# Patient Record
Sex: Male | Born: 1935 | Race: White | Hispanic: No | Marital: Married
Health system: Southern US, Community
[De-identification: ages and names within clinical notes are randomized; demographics above are authoritative.]

---

## 2006-08-30 ENCOUNTER — Ambulatory Visit: Payer: Self-pay | Admitting: Internal Medicine

## 2006-09-13 ENCOUNTER — Ambulatory Visit: Payer: Self-pay | Admitting: Unknown Physician Specialty

## 2010-08-09 ENCOUNTER — Ambulatory Visit: Payer: Self-pay | Admitting: Ophthalmology

## 2010-08-22 ENCOUNTER — Ambulatory Visit: Payer: Self-pay | Admitting: Ophthalmology

## 2010-10-12 ENCOUNTER — Ambulatory Visit: Payer: Self-pay | Admitting: Ophthalmology

## 2012-02-01 ENCOUNTER — Ambulatory Visit: Payer: Self-pay | Admitting: Ophthalmology

## 2012-02-01 LAB — POTASSIUM: Potassium: 3.6 mmol/L (ref 3.5–5.1)

## 2012-02-12 ENCOUNTER — Ambulatory Visit: Payer: Self-pay | Admitting: Ophthalmology

## 2014-12-27 NOTE — Op Note (Signed)
PATIENT NAME:  Aaron [name], Aaron [name] MR#:  [id number] DATE OF BIRTH:  Oct 08, 1926  DATE OF PROCEDURE:  02/12/2012  PREOPERATIVE DIAGNOSIS:  Cataract, left eye.    POSTOPERATIVE DIAGNOSIS:  Cataract, left eye.  PROCEDURE PERFORMED:  Extracapsular cataract extraction using phacoemulsification with placement of an Alcon [id number], 23.0-diopter posterior chamber lens, serial #[id number].  SURGEON:  Maylon PeppersSteven A. Atiya Yera, MD  ASSISTANT:  None.  ANESTHESIA:  4% lidocaine and 0.75% Marcaine in a 50/50 mixture with 10 units/mL of Hylenex added given as a peribulbar.   ANESTHESIOLOGIST:  Dr. Dimple Caseyice.   COMPLICATIONS:  None.  ESTIMATED BLOOD LOSS:  Less than 1 ml.  DESCRIPTION OF PROCEDURE:  The patient was brought to the operating room and given a peribulbar block.  The patient was then prepped and draped in the usual fashion.  The vertical rectus muscles were imbricated using 5-0 silk sutures.  These sutures were then clamped to the sterile drapes as bridle sutures.  A limbal peritomy was performed extending two clock hours and hemostasis was obtained with cautery.  A partial thickness scleral groove was made at the surgical limbus and dissected anteriorly in a lamellar dissection using an Alcon crescent knife.  The anterior chamber was entered supero-temporally with a Superblade and through the lamellar dissection with a 2.6 mm keratome.  DisCoVisc was used to replace the aqueous and a continuous tear capsulorrhexis was carried out.  Hydrodissection and hydrodelineation were carried out with balanced salt and a 27 gauge canula.  The nucleus was rotated to confirm the effectiveness of the hydrodissection.  Phacoemulsification was carried out using a divide-and-conquer technique.  Total ultrasound time was two minutes and thirty-eight seconds with an average power of 23.4 percent.  Irrigation/aspiration was used to remove the residual cortex.  DisCoVisc was used to inflate the capsule and the internal incision  was enlarged to 3 mm with the crescent knife.  The intraocular lens was folded and inserted into the capsular bag using the AcrySert delivery system.  Irrigation/aspiration was used to remove the residual DisCoVisc.  Miostat was injected into the anterior chamber through the paracentesis track to inflate the anterior chamber and induce miosis.  The wound was checked for leaks and none were found. The conjunctiva was closed with cautery and the bridle sutures were removed.  Two drops of 0.3% Vigamox were placed on the eye.   An eye shield was placed on the eye.  The patient was discharged to the recovery room in good condition.   ____________________________ Maylon PeppersSteven A. Haaris Metallo, MD sad:ap D: 02/12/2012 13:22:17 ET T: 02/12/2012 14:48:42 ET JOB#: [id number]  cc: Viviann SpareSteven A. Haiden Rawlinson, MD, <Dictator> Erline LevineSTEVEN A Merikay Lesniewski MD ELECTRONICALLY SIGNED 02/19/2012 13:01

## 2016-06-12 ENCOUNTER — Other Ambulatory Visit: Payer: Self-pay | Admitting: Internal Medicine

## 2016-06-12 DIAGNOSIS — R634 Abnormal weight loss: Secondary | ICD-10-CM

## 2016-06-12 DIAGNOSIS — R748 Abnormal levels of other serum enzymes: Secondary | ICD-10-CM

## 2016-06-16 ENCOUNTER — Ambulatory Visit
Admission: RE | Admit: 2016-06-16 | Discharge: 2016-06-16 | Disposition: A | Discharge status: Home / Self Care | Payer: Medicare Other | Source: Ambulatory Visit | Attending: Internal Medicine | Admitting: Internal Medicine

## 2016-06-16 DIAGNOSIS — R634 Abnormal weight loss: Secondary | ICD-10-CM | POA: Diagnosis present

## 2016-06-16 DIAGNOSIS — I700 Atherosclerosis of aorta: Secondary | ICD-10-CM | POA: Diagnosis not present

## 2016-06-16 DIAGNOSIS — N200 Calculus of kidney: Secondary | ICD-10-CM | POA: Insufficient documentation

## 2016-06-16 DIAGNOSIS — N400 Enlarged prostate without lower urinary tract symptoms: Secondary | ICD-10-CM | POA: Insufficient documentation

## 2016-06-16 DIAGNOSIS — R748 Abnormal levels of other serum enzymes: Secondary | ICD-10-CM | POA: Diagnosis present

## 2016-06-16 MED ORDER — IOPAMIDOL (ISOVUE-300) INJECTION 61%
100.0000 mL | Freq: Once | INTRAVENOUS | Status: AC | PRN
  Administered 2016-06-16: 100 mL via INTRAVENOUS

## 2018-01-11 IMAGING — CT CT ABD-PELV W/ CM
2 of 5 series · 14 of 46 positions shown, 16 images · IV contrast (APPLIED)
Comparison: None.

CLINICAL DATA: One month history of weight loss with abnormal liver
enzymes.

EXAM:
CT ABDOMEN AND PELVIS WITH CONTRAST
TECHNIQUE: Multidetector CT imaging of the abdomen and pelvis was performed
using the standard protocol following bolus administration of
intravenous contrast.
CONTRAST:  100mL C98C1D-RAA IOPAMIDOL (C98C1D-RAA) INJECTION 61%

[Series 2: axial st · axial · 0.71mm/px · z∈[-488,-104]mm · 11 of 89 slices shown, 13 images]
[im 6/89  soft-tissue]
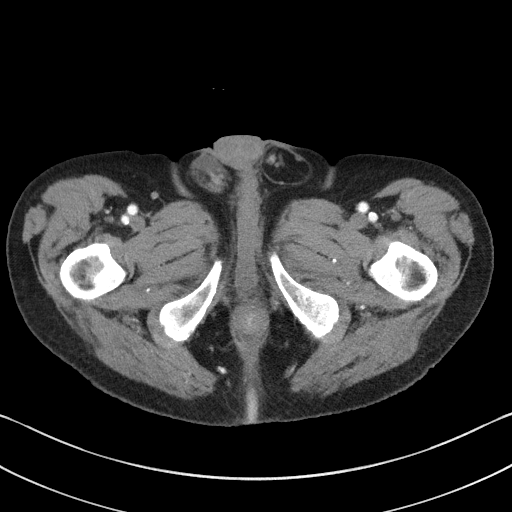
[im 6/89  bone]
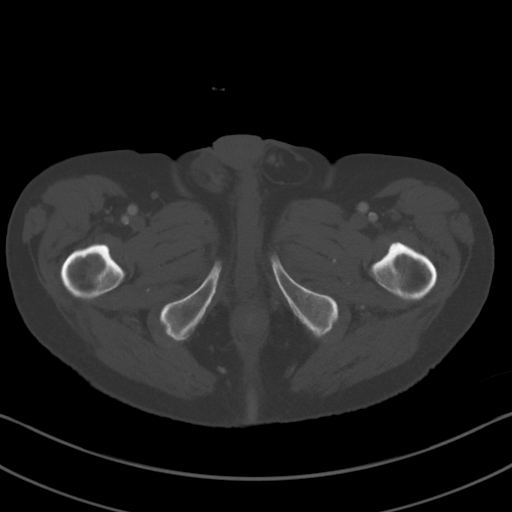
[im 17/89  soft-tissue]
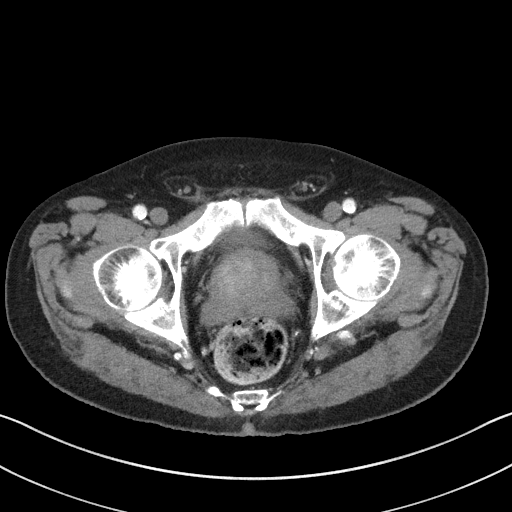
[im 23/89  soft-tissue]
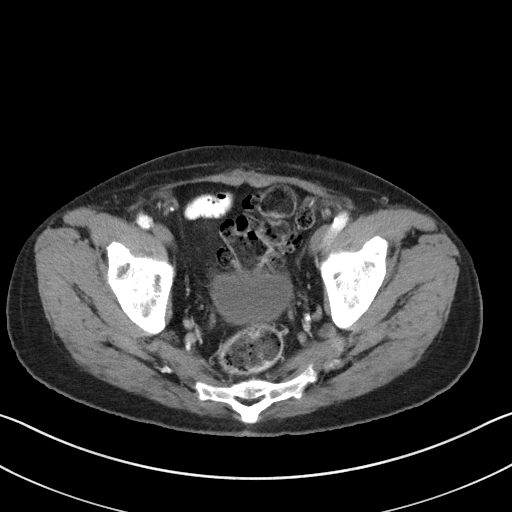
[im 28/89  soft-tissue]
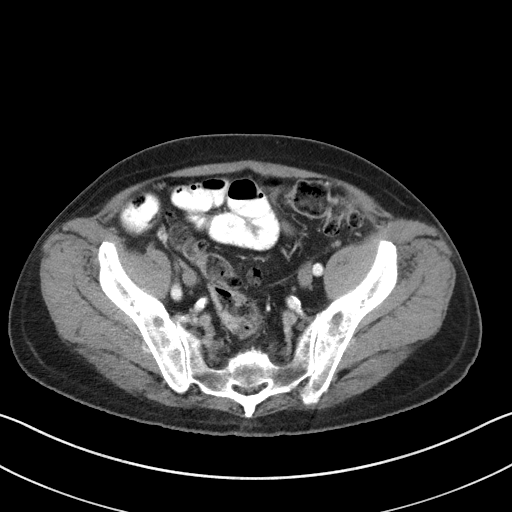
[im 39/89  soft-tissue]
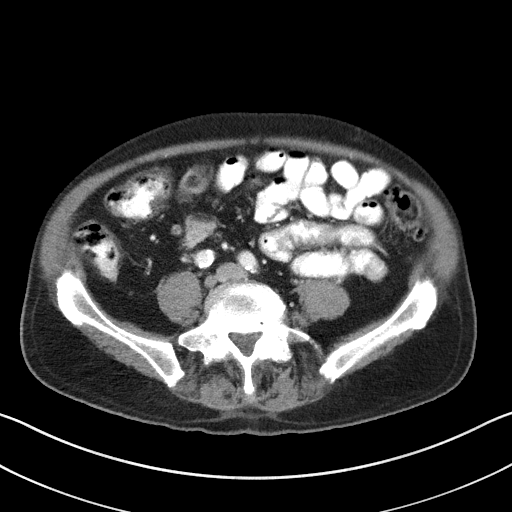
[im 45/89  soft-tissue]
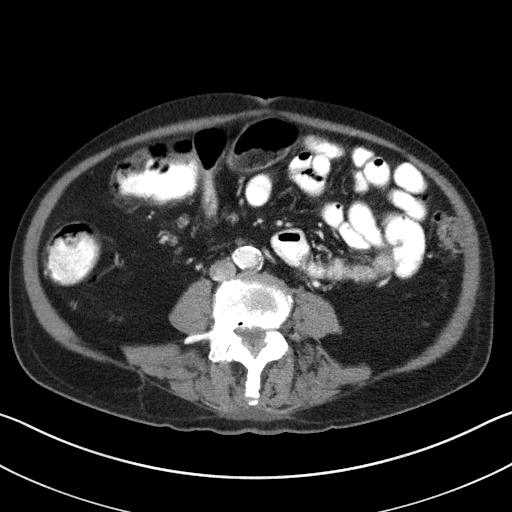
[im 50/89  soft-tissue]
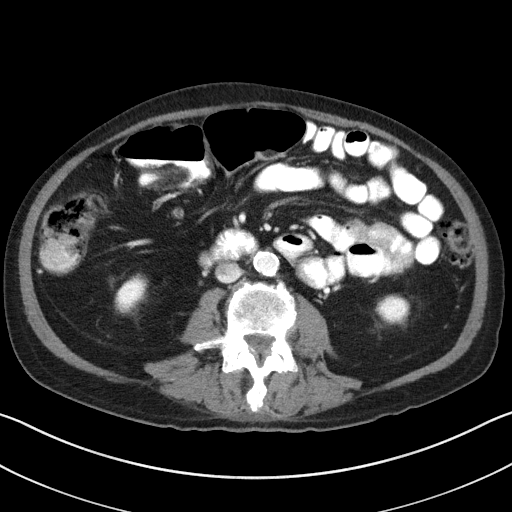
[im 61/89  soft-tissue]
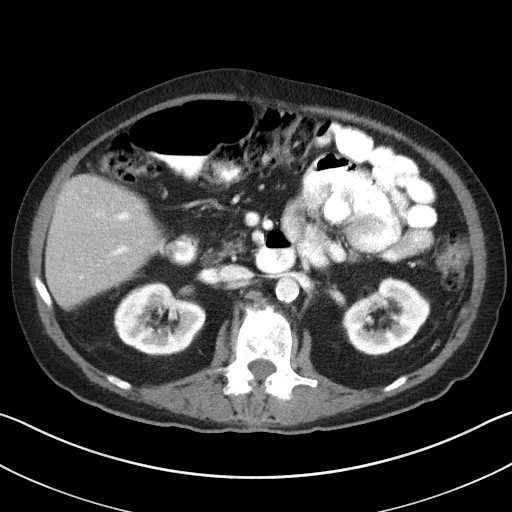
[im 67/89  soft-tissue]
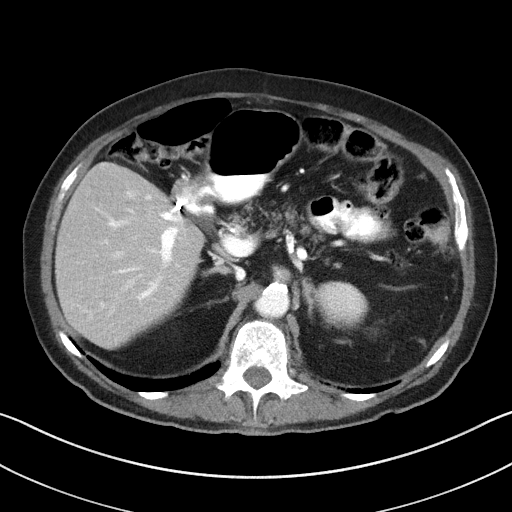
[im 67/89  bone]
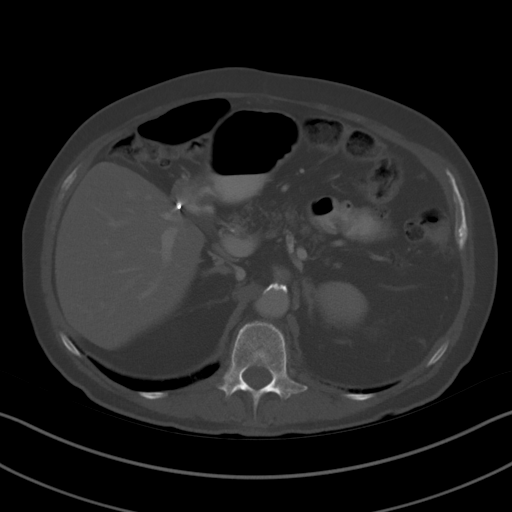
[im 72/89  soft-tissue]
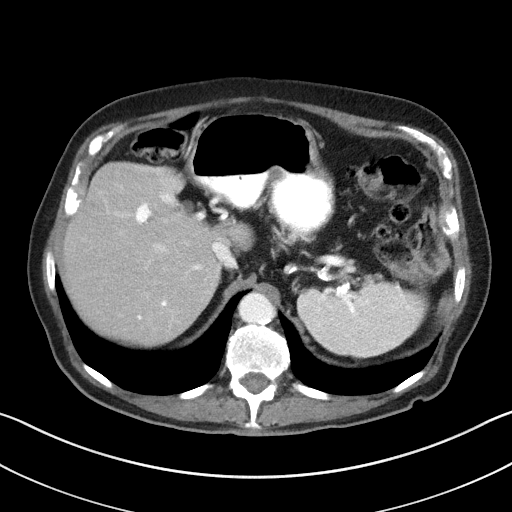
[im 83/89  soft-tissue]
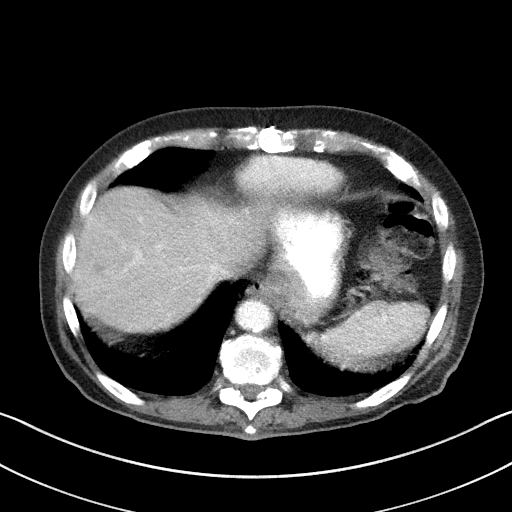

[Series 5: coronal st · coronal · 0.64mm/px · 3 of 82 slices shown]
[im 28/82  soft-tissue]
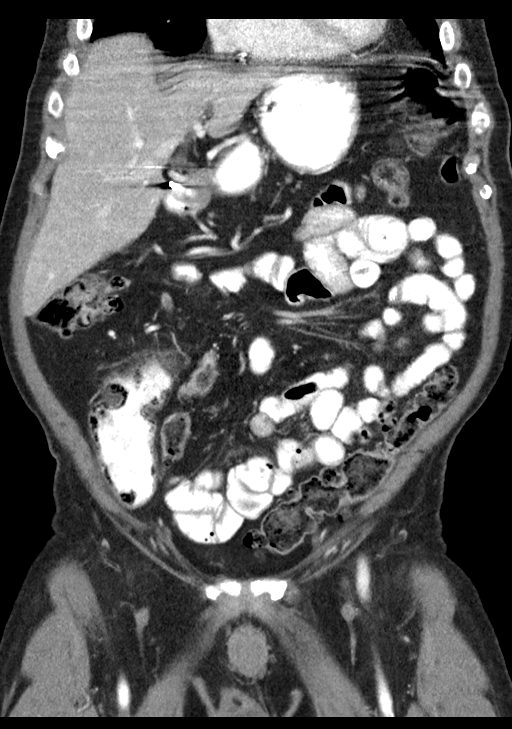
[im 37/82  soft-tissue]
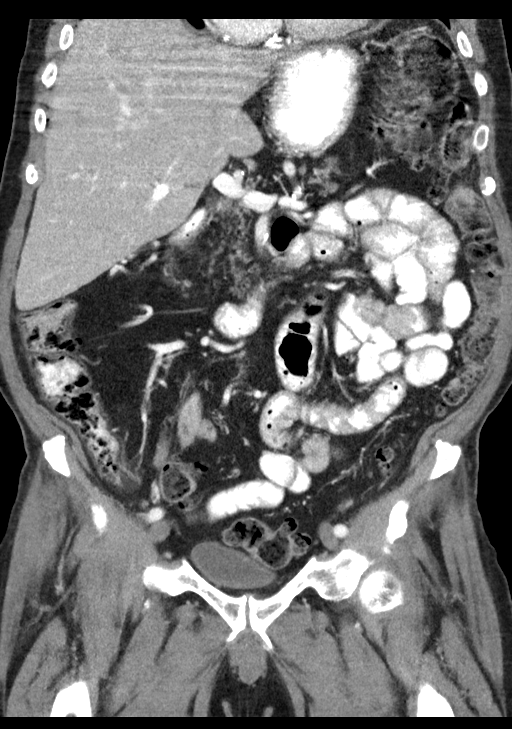
[im 46/82  soft-tissue]
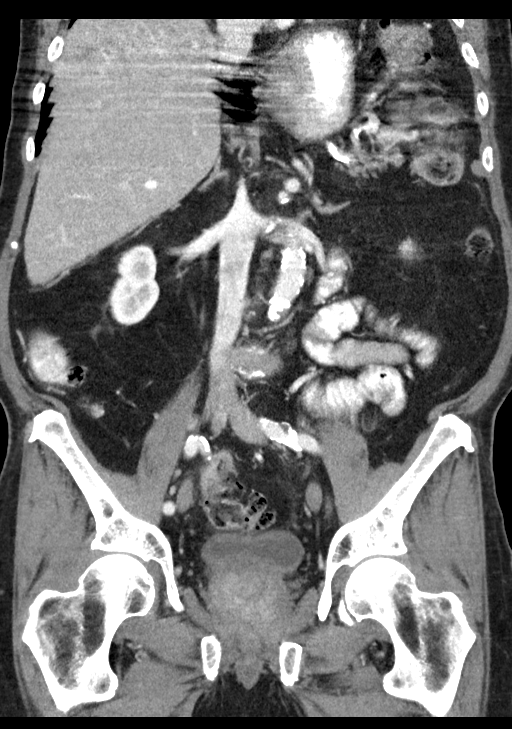

[14 of 46 positions shown; findings below may reference images not displayed]

FINDINGS: Note that there is a degree of motion artifact, particularly
affecting portions of the upper abdomen.

Lower chest: There is mild bibasilar lung atelectasis. No lung base
edema or consolidation. There is extensive native coronary artery
calcification. Patient is status post coronary artery bypass
grafting.

Hepatobiliary: Liver is approximately 22 cm in length. There is a 5
mm probable cyst near the dome of the liver on the right anteriorly.
No other focal liver lesions are evident. Gallbladder is absent.
There is no appreciable biliary duct dilatation.

Pancreas: Pancreas is partially fatty replaced. No pancreatic mass
or inflammatory focus.

Spleen: No splenic lesions are evident.

Adrenals/Urinary Tract: Adrenals appear normal bilaterally. There is
a cyst in the anterior mid left kidney measuring 9 x 6 mm. No
hydronephrosis is evident on either side. There are two, 2 mm
calculi in the periphery of the mid right kidney, nonobstructing. No
other renal calculi are identified. There is no ureteral calculus on
either side. Urinary bladder is midline with wall thickness within
normal limits.

Stomach/Bowel: Rectum is mildly distended with stool. The rectal
wall does not appear thickened, and there is no perirectal soft
tissue stranding. There are scattered sigmoid diverticula without
diverticulitis. Scattered colonic diverticula are noted elsewhere
without diverticulitis. There is no appreciable bowel wall or
mesenteric thickening. There is no evident bowel obstruction. No
free air or portal venous air. Note that there is mild dilatation of
the ascending colon with a maximum transverse diameter of 9.3 cm.
There is no other evident bowel dilatation on this study. No bowel
pneumatosis.

Vascular/Lymphatic: There is extensive atherosclerotic calcification
throughout the aorta and common iliac arteries. There is also
calcification in each common femoral and hypogastric artery regions.
There is no abdominal aortic aneurysm. There is moderate
calcification in the proximal mesenteric vessels throughout the
abdomen. There is no adenopathy in the abdomen or pelvis.

Reproductive: The prostate is diffusely enlarged and impresses on
the inferior urinary bladder. Seminal vesicles are also prominent.
There are a few tiny prostatic calculi. No pelvic mass apart from
prostatic enlargement. No free pelvic fluid evident.

Other: Appendix appears unremarkable. There is no ascites or abscess
in the abdomen or pelvis. There is fat in each inguinal ring.

Musculoskeletal: There is degenerative change in the lower thoracic
and lumbar spine regions. No blastic or lytic bone lesions are
demonstrable. There is no intramuscular or abdominal wall lesion.
IMPRESSION: There is dilatation of the ascending colon consistent with probable
early colonic ileus. No bowel obstruction. No bowel wall or
mesenteric thickening. No pneumatosis.

Rectum mildly distended with stool without surrounding mesenteric or
perinephric stranding/ thickening.

No abscess.  Appendix appears normal.

Enlarged prostate impressing on the inferior urinary bladder.
Seminal vesicles also prominent. Advise correlation with PSA.

Small nonobstructing calculi mid right kidney. No hydronephrosis or
ureteral calculi.

Extensive aortoiliac atherosclerosis.  No aneurysm.

Prominent liver without focal lesion beyond a sub cm probable small
cyst. Gallbladder absent.

## 2019-02-10 ENCOUNTER — Telehealth: Payer: Self-pay | Admitting: Primary Care

## 2019-02-10 NOTE — Telephone Encounter (Signed)
Spoke with patient's daughter Barnett Hatter and scheduled a Lewis for 02/13/19 @ 3:30 PM.

## 2019-02-13 ENCOUNTER — Other Ambulatory Visit: Payer: Self-pay

## 2019-02-13 ENCOUNTER — Other Ambulatory Visit: Payer: Medicare Other | Admitting: Primary Care

## 2019-02-13 DIAGNOSIS — Z515 Encounter for palliative care: Secondary | ICD-10-CM

## 2019-02-13 NOTE — Progress Notes (Signed)
[facility] Pall[facility] Consult Note Telephone: [phone]  Fax: [phone]  TELEHEALTH VISIT STATEMENT Due to the COVID-19 crisis, this visit was done via telemedicine from my office. It was initiated and consented to by this patient and/or family.  PATIENT NAME: Aaron [name] DOB: Aug 28, 1927 MRN: [id number]  PRIMARY CARE PROVIDER:   Rusty Aus, [place]  REFERRING PROVIDER:  Rusty Aus, MD [place],  [place] [address] [phone]  RESPONSIBLE PARTY:   Extended Emergency Contact Information Primary Emergency Contact: Pavlock,Lettie A Address: [address] [place], [place] [address] [place] Phone: [phone] Mobile Phone: [phone] Relation: Spouse  Palliative Care was asked to follow patient by consultation request of Rusty Aus, MD. This is the initial visit. Family would like assessment for hospice appropriateness. Present were by telemedicine, patient, son in law and daughter Aaron [name].  ASSESSMENT AND RECOMMENDATIONS:   1. Goals of Care: Maximize quality of life and symptom management.  2. Symptom Management:   Pain: Recommend Acetaminophen arthritis 650 mg q 8 hrs. Probably due to immobility, neurological insult. Patient restless and groaning, it is the convention to assume pain first and treat. If this does not help we will look at other factors.   Sleep disturbance/Sundowning/night awakening: Recommend melatonisn 3 mg at hs, resume zoloft, family stopped this medication.  Sleeping more in the day, and in his recliner. Family has been giving Tyenol PM, which has diphenhydramine, at hs. Education provided this can give a paradoxical effect in the elderly, recommend resuming melatonin. Also family stopped zoloft, I recommend restarting for sleep and wellbeing. Encouraged to keep him stimulated during the day and not allow day  time sleeping for hours.  Family also wishes to obtain a hospital bed for improving sleeping and comfort. I refer this to PCP to send referral to DME company. There was some discrepancy as to if patient has an open wound or not. Family said he did and home health nurse said it was stage 1. If patient has skin breakdown he would benefit from a skin protecting mattress surface.   Nutrition/Aspiration risk: Recommend ensure 2-3 x/day, recommend resolving constipation, see below. ST currently for swallowing assessment. Right sided deficits weakness, speech deficits e.g. word finding, mild expressive aphasia. Seems to have no receptive aphagia. Poor eating per family but is drinking fluids, taking thin liquids without problem. Family also endorses early satiety. Taking ensure daily.   Constipation:  I asked them to begin an OTC protocol of Miralax 17 gm daily in 8 oz fluid, senna tab bid, obtain dietary fiber e.g. gummy fiber soft chews available in pharmacies. Also to obtain milk of magnesia and give several doses to start evacuation. Please also order lactulose 30 ml daily and prn for ongoing constipation of no bm x 3 days.   Daughter reports bms x 4 occasions since patient came home from nursing home, 21 days ago. She reports no laxatives or softeners are currently in patient med protocol. Education provided that bowel function needs to be maintained to optimize intake, comfort, cognition and ability to rest.. I asked them to provide a witness to a satisfactory evacuation, and to begin both MOM and standing protocol now. I asked home health LPN to assess on Monday and I will meet with them again on Thursday. Bowel goal should be 4 soft formed stools/ week as minimum.  3. Family Supports:  Recommend CNA from home health. We will discuss other care giving options in future visits. Patient is now home x 3 weeks from SNF s/p CVA in late April 2020. [facility] has PT 2x/ week, ST also seeing patient  and SN. Not currently receiving  home health aides or OT or SW.  I requested CNA of LPN, and would ask PCP to send order to [facility] for CNA.  Pt is able to brush teeth some, feed himself with finger food, not as well with utensils.   4. Cognitive / Functional decline:  Patient had dementia at baseline, FAST Score 6 b-c. Currently rehabbing from CVA in April.    5. Advanced Care Directive: Has a living will, [facility] as wife and daughter, only child.  Does not want resuscitation or living on machines per daughter. I explained MOST form to daughter and asked that we fill it out next visit and I will upload to the [facility] data base.   6. Follow up Palliative Care Visit: Palliative care will continue to follow for goals of care clarification and symptom management. Return 1 weeks or prn. We will work on bowel program and uploading MOST form, discuss appropriate time to transition to hospice.  I spent 75 minutes providing this consultation,  from 1530 to 1645. More than 50% of the time in this consultation was spent coordinating communication.   HISTORY OF PRESENT ILLNESS:  Aaron [name] is a 85 y.o. year old male with multiple medical problems including ischemic CVA, HTN, HLD, CAD, dementia mixed, vascular and AD. Palliative Care was asked to help address goals of care.   CODE STATUS:  DNR  PPS: 40% HOSPICE ELIGIBILITY/DIAGNOSIS: TBD. Probably function is too high at this assessment for hospice admission, but I will continue to assess for appropriate referral. A hospice diagnosis is not clear at this writing. Continue to assess.  PAST MEDICAL HISTORY: No past medical history on file.  SOCIAL HX:  Social History   Tobacco Use  . Smoking status: Not on file  Substance Use Topics  . Alcohol use: Not on file    ALLERGIES: Not on File   PERTINENT MEDICATIONS:  No outpatient encounter medications on file as of 02/13/2019.   No facility-administered encounter medications on file as  of 02/13/2019.     PHYSICAL EXAM/ROS:   Current and past weights: unavailable General: NAD, frail appearing,  Cardiovascular: no chest pain reported, no edema,  Pulmonary: no cough, no increased SOB Abdomen: appetite fair to poor, early satiety, endorses constipation, but family reports has had a bm 4 x/ 21 days. MOM for 2-3 doses to get bowels to move. Other bowel preparations recommended. GU: denies dysuria, incontinent of urine at times MSK:  ambulates with a walker , stand by assist Skin: no rashes, has a back pressure injury, family reported broken skin, pink bed, measures 6 x 3 cm. Home health RN is attending, Proximel dressing being use.Later t/c to SN found out this is stage 1.  Protein intake per ensure plus.  Neurological: Weakness, h/o dementia, could do adls in home but would not be able to drive or do bills. Now after cva also has same  level of recognition but does not initiate adls.   Marijo FileKathryn M Rony Ratz DNP, MPH, AGPCN-BC

## 2019-02-20 ENCOUNTER — Other Ambulatory Visit: Payer: Medicare Other | Admitting: Primary Care

## 2019-02-20 ENCOUNTER — Other Ambulatory Visit: Payer: Self-pay

## 2019-02-20 DIAGNOSIS — Z515 Encounter for palliative care: Secondary | ICD-10-CM

## 2019-02-20 NOTE — Progress Notes (Signed)
[facility] Consult Note Telephone: [phone]  Fax: [phone]  TELEHEALTH VISIT STATEMENT Due to the COVID-19 crisis, this visit was done via telemedicine from my office. It was initiated and consented to by this patient and/or family.  PATIENT NAME: Aaron [name] DOB: 1926/12/21 MRN: [id number]  PRIMARY CARE PROVIDER:   Danella PentonMiller, Mark F, MD [phone]  REFERRING PROVIDER:  Danella PentonMiller, Mark F, MD [id number] [facility] [place] [id number] [phone]  RESPONSIBLE PARTY:   Extended Emergency Contact Information Primary Emergency Contact: Bullen,Lettie A Address: [address]          [place], [place] [address] [place] Home Phone: [phone] Mobile Phone: [phone] Relation: Spouse  Palliative Care was asked to follow this patient by consultation request of Danella PentonMiller, Mark F, MD. This is a follow up visit. Present were by telemedicine, patient and daughter Sterling Bigndrea Foy.  ASSESSMENT AND RECOMMENDATIONS:   1. Goals of Care: Maximize quality of life and symptom management.  2. Symptom Management:   Pain: Recommend Acetaminophen arthritis (time released) 650 mg q 8 hrs. This recommendation was made last week but pt has been taking tylenol PRN. I also  Recommend 50 mg tramadol q 4 h as needed. Please send to patient pharmacy. Ongoing pain from OA, stage 2/3 pressure ulcer and immobility.   Constipation: Recommend a rigorous bowel program:  3 stools this week (improved from 4 in previous 21 days), started miralax 17 mg daily, senna 1 tablet daily. Did not need lactulose, sorbitol is also a choice for prn for the future.  I instructed family to give more senna if he starts on Tramadol.   Sleep: Improved with d/c of tyenol pm (contains diphenhydramine) and initiation of Melatonin 3 mg at hs. Continue. Still has not restarted zoloft which they self d/c'ed.  Nutrition:  Continue to  offer food and fluids. Eating better, encouraging liquid intake. Taking protein supplements.ST assessment states his aspiration risk is minimal.   Ambulation: With walker, improved with home health PT currently seeing patient. Needs CNA visits as well to assist with personal care. PCP should send order to [facility] at [phone].  DME: Family wishes to get a hospital bed and needs pressure reduction mattress. Send order for hospital bed and gel overlay, for stage 2-3. [facility] [phone].  Skin integrity: Stage 3 pressure ulcer in thoracic/lumbar region. Family states there is also an ulcer in the ischial region.  Use tape windowpane or skin prep to help adhere.  3. Family /Caregiver/Community Supports: Supportive family, daughter Sue Lushndrea is POA. Has [facility] for rehab and PT, ST and RN. I have requested  An order from PCP for CNA and for hospital bed with gel overlay.  4. Cognitive / Functional decline: Patient had dementia at baseline, FAST Score 6 b-c. Currently rehabbing from CVA in April. Affect is improving now that he's home.  5. Advanced Care Directive: MOST form uploaded today in [facility] with POA, Abbe AmsterdamAndrea Loy,  DNR, comfort care, abx if needed, IV if needed, no feeding tube.   6. Follow up Palliative Care Visit: Palliative care will continue to follow for goals of care clarification and symptom management. Return 1 week or prn.  I spent 60 minutes providing this consultation,  from 1530 to 1630. More than 50% of the time in this consultation was spent coordinating communication.   HISTORY OF PRESENT ILLNESS:  Aaron [name] is a 85 y.o. year  old male with multiple medical problems including ischemic CVA, HTN, HLD, CAD, dementia mixed, vascular and AD. Palliative Care was asked to help address goals of care.   CODE STATUS: DNR, MOST with comfort care, abx if needed, IV if needed, no feeding tube.   PPS: 40% HOSPICE ELIGIBILITY/DIAGNOSIS: TBD  PAST MEDICAL  HISTORY: No past medical history on file.  SOCIAL HX:  Social History   Tobacco Use  . Smoking status: Not on file  Substance Use Topics  . Alcohol use: Not on file    ALLERGIES: Not on File   PERTINENT MEDICATIONS:  No outpatient encounter medications on file as of 02/20/2019.   No facility-administered encounter medications on file as of 02/20/2019.     PHYSICAL EXAM/ROS:   Current and past weights: unavailable General: NAD, frail appearing, thin, swallowing low risk of aspiration Cardiovascular: no chest pain reported, no edema,  Pulmonary: no cough, no increased SOB, no DOE Abdomen: appetite fair, endorses constipation, usually continent of bowel GU: denies dysuria, incontinent of urine at times MSK:  no joint deformities, weakness, ambulates with assistance Skin: no rashes, stage 2-3 PI on back, buttock Rx by home health Neurological: Weakness,fatigue,  FAST score 6d-7a. Dementia at baseline,  restarted melatonin for sleep This has helped his resting at night. Encouraged to restart zoloft.   Cyndia Skeeters DNP, MPH,  AGPCNP-BC

## 2019-02-27 ENCOUNTER — Other Ambulatory Visit: Payer: Self-pay

## 2019-02-27 ENCOUNTER — Other Ambulatory Visit: Payer: Medicare Other | Admitting: Primary Care

## 2019-02-27 DIAGNOSIS — Z515 Encounter for palliative care: Secondary | ICD-10-CM

## 2019-02-27 NOTE — Progress Notes (Signed)
[facility] Pall[facility] Consult Note Telephone: [phone]  Fax: [phone]  TELEHEALTH VISIT STATEMENT Due to the COVID-19 crisis, this visit was done via telemedicine from my office. It was initiated and consented to by this patient and/or family.  PATIENT NAME: Aaron [name] DOB: 1927-01-12 MRN: [id number]  PRIMARY CARE PROVIDER:   Rusty [name], [place]  REFERRING PROVIDER:  Rusty Aus, MD [place] [facility] [place],  [place] [address] [phone]  RESPONSIBLE PARTY:   Extended Emergency Contact Information Primary Emergency Contact: Aaron [name], Aaron [name] Phone: [phone] Mobile Phone: [phone] Relation: Daughter Secondary Emergency Contact: Aaron [name],Aaron [name] Address: [address] [place], [place] [address] [place] Phone: [phone] Mobile Phone: [phone] Relation: Spouse  Palliative Care was asked to follow this patient by consultation request of Aaron Aus, MD. This is [name] follow up visit.  ASSESSMENT AND RECOMMENDATIONS:   1. Goals of Care: Maximize quality of life and symptom management. Pain is [name] problem at this time and impacting QOL and activity.   2. Symptom Management:   PAIN: RECOMMEND TRAMADOL 50 mg q 4 hrs PRN for back pain. Patient has unrelieved pain. This pain is impacting quality of life and mobility. States codeine allergy. Uses [facility] in [place]. T/c to PCP office to request.  DME: Still needs hospital bed and overlay. Needs over the bed tray table. Needs this referral sent to [facility] fax [phone]. They will need to send supporting documentation for wounds.I called PCP office today to request.   Hospice referral: Patient had been assessed for hospice several week ago. Family would like re referral for admission after home health ends apporx 7/9.  ADLs: Requested CNA from [facility], has not been started, need  order faxed to [facility].  Skin integrity: Second area of break down on back, Rx with foam dressing  3. Family /Caregiver/Community Supports: Daughter and wife are caregivers. [facility] agency currently with patient. We discussed dovetailing into hospice in 2 weeks when home health d/c's. They should finish on or about July 9, and family wants to set up hospice admission quickly thereafter.  4. Cognitive / Functional decline: Periods of confusion are increasing and patient has decreasing ability to perform adls. No longer able to perform any IADLs.  5. Advanced Care Directive: MOST form uploaded in [place] with  DNR, comfort care, abx if needed, IV if needed, no feeding tube.   6. Follow up Palliative Care Visit: Palliative care will continue to follow for goals of care clarification and symptom management. Return 1-2 weeks or prn.  I spent 40 minutes providing this consultation,  from 1540 to 1620. More than 50% of the time in this consultation was spent coordinating communication.   HISTORY OF PRESENT ILLNESS:  Aaron [name] is [name] 85 y.o. year old male with multiple medical problems including  ischemic CVA, HTN, HLD, CAD, dementia mixed, vascular and AD. Palliative Care was asked to help address goals of care.   CODE STATUS: DNR, comfort care, abx if needed, IV if needed, no feeding tube.  PPS: 40% HOSPICE ELIGIBILITY/DIAGNOSIS: yes/CVA,   PAST MEDICAL HISTORY: No past medical history on file.  SOCIAL HX:  Social History   Tobacco Use  . Smoking status: Not on file  Substance Use Topics  . Alcohol use: Not on file    ALLERGIES:  Allergies  Allergen Reactions  .  Codeine Hives    PERTINENT MEDICATIONS:  No outpatient encounter medications on file as of 02/27/2019.   No facility-administered encounter medications on file as of 02/27/2019.     PHYSICAL EXAM/ROS:   Current and past weights:149 lb in 12/2018, 143 lb 08/2017, 141 lb 02/2017 General: NAD, frail, thin  Cardiovascular: no chest pain reported, no edema reported Pulmonary: no cough,  increased SOB, some DOE Abdomen: appetite poor, denies  constipation, now well managed with bowel plan, continent GU: denies dysuria, continent of urine.  MSK:  no joint deformities, ambulates with walker to bath room Skin: no rashes , 2 stage 2-3 wounds on back/sacrum, new wound erupted this week. Neurological: Weakness, confused at times, no falls. FAST score 6d-7a.d  Aaron FileKathryn M Remy Dia DNP, AGPCNP-BC

## 2019-03-03 ENCOUNTER — Telehealth: Payer: Self-pay | Admitting: Primary Care

## 2019-03-03 NOTE — Telephone Encounter (Signed)
Call from Barnett Hatter, patient's daughter again asking for hospital bed and pain medicine. Request was sent to PCP office on 6/25. I tried to call office but was not able to get through to any staff. I will escribe 3 days of percocet 5/325 q 4 hrs prn this afternoon and request PCP to follow up with narcotic of choice for patient's intractable back pain. He will likely transition to hospice in 10 days, when home health has finished, per his daughter's plan.

## 2019-03-07 ENCOUNTER — Other Ambulatory Visit: Payer: Medicare Other | Admitting: Primary Care

## 2019-03-07 ENCOUNTER — Other Ambulatory Visit: Payer: Self-pay

## 2019-03-07 DIAGNOSIS — Z515 Encounter for palliative care: Secondary | ICD-10-CM

## 2019-03-07 NOTE — Progress Notes (Signed)
Therap[facility], [facility] Consult Note Telephone: [phone]  Fax: [phone]  TELEHEALTH VISIT STATEMENT Due to the COVID-19 crisis, this visit was done via telemedicine from my office. It was initiated and consented to by this patient and/or family.  PATIENT NAME: Aaron Quartodrian M Hanratty DOB: 1927-02-08 MRN: [id number]  PRIMARY CARE PROVIDER:   Danella PentonMiller, Mark F, MD [phone]  REFERRING PROVIDER:  Danella PentonMiller, Mark F, MD [address] [facility] [place] [id number] [phone]  RESPONSIBLE PARTY:   Extended Emergency Contact Information Primary Emergency Contact: Laurena SlimmerLoy, Andrea Rehberg Home Phone: [phone] Mobile Phone: [phone] Relation: Daughter Secondary Emergency Contact: Nieto,Lettie A Address: [address]          [place], [place] [address] [place] Home Phone: [phone] Mobile Phone: [phone] Relation: Spouse  Palliative Care was asked to follow this patient by consultation request of Danella PentonMiller, Mark F, MD. This is a follow up visit.   ASSESSMENT AND RECOMMENDATIONS:   1. Goals of Care: Maximize quality of life and symptom management.  2. Symptom Management:   Pain: Pain has been 8/10. Using tramadol 50 mg with mild relief but not complete, and only has enough for twice a day. I have sent some percocet 5/325 #18 for them to trial over weekend. Patient pain today appears 7-8/10 on PAIN AD scale. Education RE possible hive  reaction, re increased but transient somnolence, and increased constipation. Patient will need more aggressive pain management as pain is poorly controlled at this time which impacts his quality of life.  Constipation : Improved with protocol,  I educated to give more with increased narcotic.  Appetite: Family states it is good, eats good breakfast, takes supplements 1-2/ day. Weight is poor, patient is cachectic.  Home Health: Has had PT for ambulation  Need  gait belt for safety,  I asked family to ask Home health to provide one. Denies falls. DME still not arrived.  Back wound: covered with Foam dressing. No new lesions.  3. Family /Caregiver/Community Supports: Living with wife and daughter is here at night. They have hired assistance during day. Home health services are ending next week and patient is appropriate for hospice referral  At this time.  4. Cognitive / Functional decline: Dementia, FAST stage 7 a, family quizzes patient on what he ate, who people are and he does not know. He is functionally declining with PPS of 30%. Cannot perform many adls and no Ialds, ambulates with assistance.  5. Advanced Care Directive: MOST form on file in [facility], DNR, comfort measures only, limited trial interventions.  6. Follow up Palliative Care Visit: Palliative care will continue to follow for goals of care clarification and symptom management. Return 1-2 weeks or prn.  Previous conversations with daughter involved hospice referral once home health finished therapy, which will be next week. DME still has not been delivered from [facility]. This may need to wait for hospice admission if later next week.  I spent 60 minutes providing this consultation,  from 1015  to 1140. More than 50% of the time in this consultation was spent coordinating communication.   HISTORY OF PRESENT ILLNESS:  Aaron [name] is a 85 y.o. year old male with multiple medical problems including ischemic CVA, HTN, HLD, CAD, dementia mixed, vascular and AD. Palliative Care was asked to help address goals of care.   CODE STATUS: DNR/DNI, comfort measures, limited interventions  PPS: 30% HOSPICE ELIGIBILITY/DIAGNOSIS: yes/CVA  PAST  MEDICAL HISTORY: No past medical history on file.  SOCIAL HX:  Social History   Tobacco Use  . Smoking status: Not on file  Substance Use Topics  . Alcohol use: Not on file    ALLERGIES:  Allergies  Allergen Reactions  . Codeine Hives      PERTINENT MEDICATIONS:  No outpatient encounter medications on file as of 03/07/2019.   No facility-administered encounter medications on file as of 03/07/2019.     PHYSICAL EXAM/ROS:   Current and past weights:  Today =115 wt.149 lb in 12/2018, 143 lb 08/2017, 141 lb 02/2017 Taking Boost 1-2 a day,  General: NAD, frail appearing, thin, cold natured Cardiovascular: no chest pain reported, no edema in LE Pulmonary: no cough, no increased SOB Abdomen: appetite fair, controlled constipation, continent of bowel GU: denies dysuria, incontinent of urine MSK:  Using walker for ambulation, has a raised toilet seat if needed Skin: wound on back, covered with foam Neurological: Weakness,  Forgetful, Dementia Stage 6e-7a,  Estevan Oaks, NP  Covid Screening Tool:  1.  Is the patient or any family member in the home showing any signs or symptoms regarding respiratory infection?                                                                                                                      a. Fever                                    No:   no          b. Shortness of breath          No:   no        c. Cough/congestion          No:   no          d. Body aches/pains           No:  no           2. Within the past 14 days, has anyone living in the home had any contact with someone with or under investigation for COVID-19?     No:

## 2019-04-05 DEATH — deceased

## 2019-08-01 ENCOUNTER — Telehealth: Payer: Self-pay | Admitting: Primary Care

## 2019-08-01 NOTE — Telephone Encounter (Signed)
Patient expired on 2019/03/31 per posted obituary.
